# Patient Record
Sex: Female | Born: 1935 | Race: White | Hispanic: No | Marital: Married
Health system: Southern US, Community
[De-identification: ages and names within clinical notes are randomized; demographics above are authoritative.]

---

## 2010-07-05 DEATH — deceased

## 2018-10-03 ENCOUNTER — Inpatient Hospital Stay
Admit: 2018-10-03 | Discharge: 2018-10-18 | Disposition: A | Discharge status: Skilled Nursing Facility | Payer: Medicare Other | Source: Other Acute Inpatient Hospital | Attending: Internal Medicine | Admitting: Internal Medicine

## 2018-10-03 ENCOUNTER — Other Ambulatory Visit (HOSPITAL_COMMUNITY): Payer: Medicare Other

## 2018-10-03 DIAGNOSIS — Z9889 Other specified postprocedural states: Secondary | ICD-10-CM

## 2018-10-03 DIAGNOSIS — J189 Pneumonia, unspecified organism: Secondary | ICD-10-CM

## 2018-10-03 DIAGNOSIS — Z4659 Encounter for fitting and adjustment of other gastrointestinal appliance and device: Secondary | ICD-10-CM

## 2018-10-03 DIAGNOSIS — Z9911 Dependence on respirator [ventilator] status: Secondary | ICD-10-CM

## 2018-10-03 DIAGNOSIS — J90 Pleural effusion, not elsewhere classified: Secondary | ICD-10-CM

## 2018-10-04 ENCOUNTER — Other Ambulatory Visit (HOSPITAL_COMMUNITY): Payer: Medicare Other

## 2018-10-04 LAB — CBC WITH DIFFERENTIAL/PLATELET
Abs Immature Granulocytes: 0.16 10*3/uL — ABNORMAL HIGH (ref 0.00–0.07)
Basophils Absolute: 0 10*3/uL (ref 0.0–0.1)
Basophils Relative: 0 %
Eosinophils Absolute: 0.1 10*3/uL (ref 0.0–0.5)
Eosinophils Relative: 1 %
HCT: 25.2 % — ABNORMAL LOW (ref 36.0–46.0)
Hemoglobin: 7.8 g/dL — ABNORMAL LOW (ref 12.0–15.0)
Immature Granulocytes: 2 %
Lymphocytes Relative: 10 %
Lymphs Abs: 0.9 10*3/uL (ref 0.7–4.0)
MCH: 32.1 pg (ref 26.0–34.0)
MCHC: 31 g/dL (ref 30.0–36.0)
MCV: 103.7 fL — ABNORMAL HIGH (ref 80.0–100.0)
Monocytes Absolute: 0.7 10*3/uL (ref 0.1–1.0)
Monocytes Relative: 7 %
Neutro Abs: 7.2 10*3/uL (ref 1.7–7.7)
Neutrophils Relative %: 80 %
Platelets: 145 10*3/uL — ABNORMAL LOW (ref 150–400)
RBC: 2.43 MIL/uL — ABNORMAL LOW (ref 3.87–5.11)
RDW: 15.5 % (ref 11.5–15.5)
WBC: 9 10*3/uL (ref 4.0–10.5)
nRBC: 1.9 % — ABNORMAL HIGH (ref 0.0–0.2)

## 2018-10-04 LAB — COMPREHENSIVE METABOLIC PANEL
ALT: 19 U/L (ref 0–44)
AST: 21 U/L (ref 15–41)
Albumin: 2.5 g/dL — ABNORMAL LOW (ref 3.5–5.0)
Alkaline Phosphatase: 63 U/L (ref 38–126)
Anion gap: 7 (ref 5–15)
BUN: 34 mg/dL — ABNORMAL HIGH (ref 8–23)
CO2: 28 mmol/L (ref 22–32)
Calcium: 8.1 mg/dL — ABNORMAL LOW (ref 8.9–10.3)
Chloride: 110 mmol/L (ref 98–111)
Creatinine, Ser: 0.76 mg/dL (ref 0.44–1.00)
GFR calc Af Amer: >60 mL/min (ref >60)
GFR calc non Af Amer: >60 mL/min (ref >60)
Glucose, Bld: 195 mg/dL — ABNORMAL HIGH (ref 70–99)
Potassium: 4.2 mmol/L (ref 3.5–5.1)
Sodium: 145 mmol/L (ref 135–145)
Total Bilirubin: 0.7 mg/dL (ref 0.3–1.2)
Total Protein: 5.1 g/dL — ABNORMAL LOW (ref 6.5–8.1)

## 2018-10-04 LAB — HEMOGLOBIN A1C
Hgb A1c MFr Bld: 6.6 % — ABNORMAL HIGH (ref 4.8–5.6)
Mean Plasma Glucose: 142.72 mg/dL

## 2018-10-04 LAB — T4, FREE: Free T4: 1.05 ng/dL (ref 0.61–1.12)

## 2018-10-04 LAB — TSH: TSH: 0.658 u[IU]/mL (ref 0.350–4.500)

## 2018-10-04 LAB — PROTIME-INR
INR: 1.1 (ref 0.8–1.2)
Prothrombin Time: 14.2 seconds (ref 11.4–15.2)

## 2018-10-04 LAB — PHOSPHORUS: Phosphorus: 2.8 mg/dL (ref 2.5–4.6)

## 2018-10-04 LAB — MAGNESIUM: Magnesium: 2.5 mg/dL — ABNORMAL HIGH (ref 1.7–2.4)

## 2018-10-04 MED ORDER — ATORVASTATIN CALCIUM 40 MG PO TABS
40.00 | ORAL_TABLET | ORAL | Status: DC
Start: 2018-10-04 — End: 2018-10-04

## 2018-10-04 MED ORDER — ASPIRIN 81 MG PO CHEW
81.00 | CHEWABLE_TABLET | ORAL | Status: DC
Start: 2018-10-04 — End: 2018-10-04

## 2018-10-04 MED ORDER — GENERIC EXTERNAL MEDICATION
75.00 | Status: DC
Start: 2018-10-04 — End: 2018-10-04

## 2018-10-04 MED ORDER — FUROSEMIDE 40 MG PO TABS
40.00 | ORAL_TABLET | ORAL | Status: DC
Start: 2018-10-04 — End: 2018-10-04

## 2018-10-04 MED ORDER — CLOPIDOGREL BISULFATE 75 MG PO TABS
75.00 | ORAL_TABLET | ORAL | Status: DC
Start: 2018-10-04 — End: 2018-10-04

## 2018-10-04 MED ORDER — HYDRALAZINE HCL 20 MG/ML IJ SOLN
5.00 | INTRAMUSCULAR | Status: DC
Start: ? — End: 2018-10-04

## 2018-10-04 MED ORDER — INSULIN LISPRO 100 UNIT/ML ~~LOC~~ SOLN
1.00 | SUBCUTANEOUS | Status: DC
Start: 2018-10-03 — End: 2018-10-04

## 2018-10-04 MED ORDER — GENERIC EXTERNAL MEDICATION
Status: DC
Start: ? — End: 2018-10-04

## 2018-10-04 MED ORDER — LISINOPRIL 5 MG PO TABS
10.00 | ORAL_TABLET | ORAL | Status: DC
Start: 2018-10-04 — End: 2018-10-04

## 2018-10-04 MED ORDER — ONDANSETRON 4 MG PO TBDP
4.00 | ORAL_TABLET | ORAL | Status: DC
Start: ? — End: 2018-10-04

## 2018-10-04 MED ORDER — TRAMADOL HCL 50 MG PO TABS
50.00 | ORAL_TABLET | ORAL | Status: DC
Start: ? — End: 2018-10-04

## 2018-10-04 MED ORDER — GLUCOSE 40 % PO GEL
15.00 | ORAL | Status: DC
Start: ? — End: 2018-10-04

## 2018-10-04 MED ORDER — METOPROLOL TARTRATE 25 MG PO TABS
25.00 | ORAL_TABLET | ORAL | Status: DC
Start: 2018-10-03 — End: 2018-10-04

## 2018-10-04 MED ORDER — AMLODIPINE BESYLATE 5 MG PO TABS
5.00 | ORAL_TABLET | ORAL | Status: DC
Start: ? — End: 2018-10-04

## 2018-10-04 MED ORDER — ACETAMINOPHEN 500 MG PO TABS
1000.00 | ORAL_TABLET | ORAL | Status: DC
Start: 2018-10-03 — End: 2018-10-04

## 2018-10-04 MED ORDER — FAMOTIDINE 20 MG PO TABS
20.00 | ORAL_TABLET | ORAL | Status: DC
Start: 2018-10-03 — End: 2018-10-04

## 2018-10-04 MED ORDER — HEPARIN SODIUM (PORCINE) 5000 UNIT/ML IJ SOLN
5000.00 | INTRAMUSCULAR | Status: DC
Start: 2018-10-03 — End: 2018-10-04

## 2018-10-04 NOTE — Consult Note (Signed)
Referring Physician: Dr. Ruthann Cancer, MD  Victoria Cantrell is an 85 y.o. female.                       Chief Complaint: Shortness of breath  HPI: 85 years old white female with recent surgery for severe MR and TR with annuloplasties, CAD with stent in 2018, paroxysmal atrial fibrillation, s/p AV nodal ablation, PPM, left atrial exclusion and bovine pericardial patch repair of superior vena cava stenosis has recurrent right pleural effusion. She also has acute on chronic dysphagia and aspiration, h/o vocal cord cyst removal, left renal artery stenosis s/p stent, type 2 DM, hypertension and GERD.   Past medical history: Type 2 DM, chronic dysphagia, paroxysmal atrial fibrillation and as per HPI.  PSH: Cholecystectomy. Inguinal hernia repair, hysterectomy,  Left total hip replacement,  Partial thyroidectomy cyst removal,  MV 26 mm Medtronic Profile 3 D ring,  TV 29 mm Medronic Simulus FLX-C Band annuloplasty ring SVC bovine pericardial patch for stenosis.  The histories are not reviewed yet. Please review them in the "History" navigator section and refresh this [place].  No family history on file. Social History:  No history of tobacco, alcohol, and drug abuse.  Allergies: Not on File  No medications prior to admission.    Results for orders placed or performed during the hospital encounter of 10/03/18 (from the past 48 hour(s))  Comprehensive metabolic panel     Status: Abnormal   Collection Time: 10/04/18  6:55 AM  Result Value Ref Range   Sodium 145 135 - 145 mmol/L   Potassium 4.2 3.5 - 5.1 mmol/L   Chloride 110 98 - 111 mmol/L   CO2 28 22 - 32 mmol/L   Glucose, Bld 195 (H) 70 - 99 mg/dL   BUN 34 (H) 8 - 23 mg/dL   Creatinine, Ser 0.76 0.44 - 1.00 mg/dL   Calcium 8.1 (L) 8.9 - 10.3 mg/dL   Total Protein 5.1 (L) 6.5 - 8.1 g/dL   Albumin 2.5 (L) 3.5 - 5.0 g/dL   AST 21 15 - 41 U/L   ALT 19 0 - 44 U/L   Alkaline Phosphatase 63 38 - 126 U/L   Total Bilirubin 0.7 0.3 -  1.2 mg/dL   GFR calc non Af Amer >60 >60 mL/min   GFR calc Af Amer >60 >60 mL/min   Anion gap 7 5 - 15    Comment: Performed at [facility], [address], [place], [place] [address]  CBC with Differential/Platelet     Status: Abnormal   Collection Time: 10/04/18  6:55 AM  Result Value Ref Range   WBC 9.0 4.0 - 10.5 K/uL   RBC 2.43 (L) 3.87 - 5.11 MIL/uL   Hemoglobin 7.8 (L) 12.0 - 15.0 g/dL   HCT 25.2 (L) 36.0 - 46.0 %   MCV 103.7 (H) 80.0 - 100.0 fL   MCH 32.1 26.0 - 34.0 pg   MCHC 31.0 30.0 - 36.0 g/dL   RDW 15.5 11.5 - 15.5 %   Platelets 145 (L) 150 - 400 K/uL   nRBC 1.9 (H) 0.0 - 0.2 %   Neutrophils Relative % 80 %   Neutro Abs 7.2 1.7 - 7.7 K/uL   Lymphocytes Relative 10 %   Lymphs Abs 0.9 0.7 - 4.0 K/uL   Monocytes Relative 7 %   Monocytes Absolute 0.7 0.1 - 1.0 K/uL   Eosinophils Relative 1 %   Eosinophils Absolute 0.1 0.0 - 0.5  K/uL   Basophils Relative 0 %   Basophils Absolute 0.0 0.0 - 0.1 K/uL   Immature Granulocytes 2 %   Abs Immature Granulocytes 0.16 (H) 0.00 - 0.07 K/uL    Comment: Performed at [facility], [address] [place], [place] [address]  Protime-INR     Status: None   Collection Time: 10/04/18  6:55 AM  Result Value Ref Range   Prothrombin Time 14.2 11.4 - 15.2 seconds   INR 1.1 0.8 - 1.2    Comment: (NOTE) INR goal varies based on device and disease states. Performed at [facility], [address] [place], [place] [address]   Magnesium     Status: Abnormal   Collection Time: 10/04/18  6:55 AM  Result Value Ref Range   Magnesium 2.5 (H) 1.7 - 2.4 mg/dL    Comment: Performed at [facility], [address] [place], [place] [address]  Phosphorus     Status: None   Collection Time: 10/04/18  6:55 AM  Result Value Ref Range   Phosphorus 2.8 2.5 - 4.6 mg/dL    Comment: Performed at [facility], [address] [place], [place] [address]  T4, free     Status: None   Collection Time: 10/04/18  6:55 AM   Result Value Ref Range   Free T4 1.05 0.61 - 1.12 ng/dL    Comment: (NOTE) Biotin ingestion may interfere with free T4 tests. If the results are inconsistent with the TSH level, previous test results, or the clinical presentation, then consider biotin interference. If needed, order repeat testing after stopping biotin. Performed at [facility], [address] [place], [place] [address]   TSH     Status: None   Collection Time: 10/04/18  6:55 AM  Result Value Ref Range   TSH 0.658 0.350 - 4.500 uIU/mL    Comment: Performed by a 3rd Generation assay with a functional sensitivity of <=0.01 uIU/mL. Performed at [facility], [address] [place], [place] [address]   Hemoglobin A1c     Status: Abnormal   Collection Time: 10/04/18  6:55 AM  Result Value Ref Range   Hgb A1c MFr Bld 6.6 (H) 4.8 - 5.6 %    Comment: (NOTE) Pre diabetes:          5.7%-6.4% Diabetes:              >6.4% Glycemic control for   <7.0% adults with diabetes    Mean Plasma Glucose 142.72 mg/dL    Comment: Performed at [facility], [address] [place], [place] [address]   Dg Abd 1 View  Result Date: 10/03/2018 CLINICAL DATA:  NG tube placement. EXAM: ABDOMEN - 1 VIEW COMPARISON:  None. FINDINGS: There is a feeding tube in place. Tube appears to be looped from the distal antrum back into the midbody of the stomach. No dilated bowel. Contrast throughout the nondistended colon. IMPRESSION: Feeding tube appears to be looped from the distal antrum back into the midbody of the stomach. Electronically Signed   By: Francene BoyersJames  Maxwell M.D.   On: 10/03/2018 17:09   Dg Chest Port 1 View  Result Date: 10/04/2018 CLINICAL DATA:  Check pleural effusion EXAM: PORTABLE CHEST 1 VIEW COMPARISON:  None FINDINGS: Cardiac shadow is mildly enlarged. Postsurgical changes are seen. Feeding catheter extends into the stomach. Bilateral pleural effusions are noted large on the left and small on the right. No  pneumothorax is seen. Underlying atelectasis is likely present bilaterally. Pacing device is seen. IMPRESSION: Bilateral pleural effusions left considerably greater than right. Electronically Signed   By: Alcide CleverMark  Lukens M.D.   On: 10/04/2018 08:16    Review Of Systems As per HPI and PMH.  There were no vitals taken for this visit. There is no height or weight on file to calculate BMI. General appearance: alert, cooperative, appears stated age and no distress Head: Normocephalic, atraumatic. Eyes: Blue eyes, pale conjunctiva, corneas clear. PERRL, EOM's intact. Neck: No adenopathy, no carotid bruit, no JVD, supple, symmetrical, trachea midline and thyroid not enlarged. Resp: Basal crackles to auscultation bilaterally. Cardio: Regular rate and rhythm, S1, S2 normal, II/VI systolic murmur, no click, rub or gallop GI: Soft, non-tender; bowel sounds normal; no organomegaly. Extremities: No edema, cyanosis or clubbing. Skin: Warm and dry.  Neurologic: Alert and oriented X 3, normal strength.   Assessment/Plan Right pleural effusion S/P MV annuloplasty ring for severe MR S/P TV annuloplasty ring for severe TR S/P SVC stenosis repair Type 2 DM Hypothyroidism CAD Paroxysmal atrial fibrillation  Awaiting thoracentesis Continue medical treatment 2 D echocardiogram. EKG.  Time spent: Review of old records, Lab, x-rays, EKG, other cardiac tests, examination, discussion with patient over 70 minutes.  Ricki RodriguezAjay S Nathanyal Ashmead, MD  10/04/2018, 11:34 AM

## 2018-10-05 LAB — SARS CORONAVIRUS 2 BY RT PCR (HOSPITAL ORDER, PERFORMED IN ~~LOC~~ HOSPITAL LAB): SARS Coronavirus 2: NEGATIVE

## 2018-10-05 NOTE — Progress Notes (Signed)
Request to IR for thoracentesis - per RN yesterday (7/31) patient has not had COVID test in last 72H. Per radiology protocol all aerosol generating procedures must have COVID test within 72H of procedure. This was explained to patient's RN and order was placed for COVID test to be collected.  Upon chart review today COVID test remains listed as active, but uncollected. I spoke with patient's RN today who states that she was not the RN on duty yesterday and is PRN, as such she was not aware that this test needed to be collected. She states she will work on getting that collected.  IR will continue to follow chart for COVID test results and will perform thoracentesis once results are received.   Candiss Norse, PA-C

## 2018-10-06 ENCOUNTER — Other Ambulatory Visit (HOSPITAL_COMMUNITY): Payer: Medicare Other

## 2018-10-06 ENCOUNTER — Institutional Professional Consult (permissible substitution) (HOSPITAL_COMMUNITY): Payer: Medicare Other

## 2018-10-06 LAB — BASIC METABOLIC PANEL
Anion gap: 11 (ref 5–15)
BUN: 27 mg/dL — ABNORMAL HIGH (ref 8–23)
CO2: 30 mmol/L (ref 22–32)
Calcium: 8.4 mg/dL — ABNORMAL LOW (ref 8.9–10.3)
Chloride: 102 mmol/L (ref 98–111)
Creatinine, Ser: 0.72 mg/dL (ref 0.44–1.00)
GFR calc Af Amer: >60 mL/min (ref >60)
GFR calc non Af Amer: >60 mL/min (ref >60)
Glucose, Bld: 193 mg/dL — ABNORMAL HIGH (ref 70–99)
Potassium: 3.6 mmol/L (ref 3.5–5.1)
Sodium: 143 mmol/L (ref 135–145)

## 2018-10-06 LAB — GLUCOSE, PLEURAL OR PERITONEAL FLUID: Glucose, Fluid: 192 mg/dL

## 2018-10-06 LAB — PROTEIN, PLEURAL OR PERITONEAL FLUID: Total protein, fluid: <3 g/dL

## 2018-10-06 LAB — GRAM STAIN

## 2018-10-06 LAB — LACTATE DEHYDROGENASE, PLEURAL OR PERITONEAL FLUID: LD, Fluid: 133 U/L — ABNORMAL HIGH (ref 3–23)

## 2018-10-06 MED ORDER — LIDOCAINE HCL (PF) 1 % IJ SOLN
INTRAMUSCULAR | Status: AC
  Filled 2018-10-06: qty 30

## 2018-10-06 NOTE — Procedures (Signed)
PROCEDURE SUMMARY:  Successful image-guided left thoracentesis. Yielded 580 milliliters of bloody fluid. Patient tolerated procedure well. EBL: Zero No immediate complications.  Specimen was sent for labs. Post procedure CXR shows no pneumothorax.  Please see imaging section of Epic for full dictation.  Joaquim Nam PA-C 10/06/2018 10:28 AM

## 2018-10-07 LAB — CBC
HCT: 28 % — ABNORMAL LOW (ref 36.0–46.0)
Hemoglobin: 8.8 g/dL — ABNORMAL LOW (ref 12.0–15.0)
MCH: 32.4 pg (ref 26.0–34.0)
MCHC: 31.4 g/dL (ref 30.0–36.0)
MCV: 102.9 fL — ABNORMAL HIGH (ref 80.0–100.0)
Platelets: 217 10*3/uL (ref 150–400)
RBC: 2.72 MIL/uL — ABNORMAL LOW (ref 3.87–5.11)
RDW: 16.8 % — ABNORMAL HIGH (ref 11.5–15.5)
WBC: 13.2 10*3/uL — ABNORMAL HIGH (ref 4.0–10.5)
nRBC: 1.4 % — ABNORMAL HIGH (ref 0.0–0.2)

## 2018-10-07 LAB — PH, BODY FLUID: pH, Body Fluid: 7.6

## 2018-10-07 LAB — BASIC METABOLIC PANEL
Anion gap: 10 (ref 5–15)
BUN: 23 mg/dL (ref 8–23)
CO2: 31 mmol/L (ref 22–32)
Calcium: 8.4 mg/dL — ABNORMAL LOW (ref 8.9–10.3)
Chloride: 102 mmol/L (ref 98–111)
Creatinine, Ser: 0.67 mg/dL (ref 0.44–1.00)
GFR calc Af Amer: >60 mL/min (ref >60)
GFR calc non Af Amer: >60 mL/min (ref >60)
Glucose, Bld: 197 mg/dL — ABNORMAL HIGH (ref 70–99)
Potassium: 3.5 mmol/L (ref 3.5–5.1)
Sodium: 143 mmol/L (ref 135–145)

## 2018-10-07 LAB — MAGNESIUM: Magnesium: 2.4 mg/dL (ref 1.7–2.4)

## 2018-10-08 LAB — MAGNESIUM: Magnesium: 2.5 mg/dL — ABNORMAL HIGH (ref 1.7–2.4)

## 2018-10-08 LAB — CBC
HCT: 26.5 % — ABNORMAL LOW (ref 36.0–46.0)
Hemoglobin: 8.2 g/dL — ABNORMAL LOW (ref 12.0–15.0)
MCH: 31.8 pg (ref 26.0–34.0)
MCHC: 30.9 g/dL (ref 30.0–36.0)
MCV: 102.7 fL — ABNORMAL HIGH (ref 80.0–100.0)
Platelets: 216 10*3/uL (ref 150–400)
RBC: 2.58 MIL/uL — ABNORMAL LOW (ref 3.87–5.11)
RDW: 16.4 % — ABNORMAL HIGH (ref 11.5–15.5)
WBC: 11.1 10*3/uL — ABNORMAL HIGH (ref 4.0–10.5)
nRBC: 1.1 % — ABNORMAL HIGH (ref 0.0–0.2)

## 2018-10-08 LAB — BASIC METABOLIC PANEL
Anion gap: 9 (ref 5–15)
BUN: 24 mg/dL — ABNORMAL HIGH (ref 8–23)
CO2: 33 mmol/L — ABNORMAL HIGH (ref 22–32)
Calcium: 8.1 mg/dL — ABNORMAL LOW (ref 8.9–10.3)
Chloride: 98 mmol/L (ref 98–111)
Creatinine, Ser: 0.58 mg/dL (ref 0.44–1.00)
GFR calc Af Amer: >60 mL/min (ref >60)
GFR calc non Af Amer: >60 mL/min (ref >60)
Glucose, Bld: 120 mg/dL — ABNORMAL HIGH (ref 70–99)
Potassium: 3.3 mmol/L — ABNORMAL LOW (ref 3.5–5.1)
Sodium: 140 mmol/L (ref 135–145)

## 2018-10-08 LAB — HEMOGLOBIN A1C
Hgb A1c MFr Bld: 6.2 % — ABNORMAL HIGH (ref 4.8–5.6)
Mean Plasma Glucose: 131.24 mg/dL

## 2018-10-08 LAB — PHOSPHORUS: Phosphorus: 3.3 mg/dL (ref 2.5–4.6)

## 2018-10-09 LAB — BASIC METABOLIC PANEL
Anion gap: 10 (ref 5–15)
BUN: 23 mg/dL (ref 8–23)
CO2: 31 mmol/L (ref 22–32)
Calcium: 8 mg/dL — ABNORMAL LOW (ref 8.9–10.3)
Chloride: 96 mmol/L — ABNORMAL LOW (ref 98–111)
Creatinine, Ser: 0.51 mg/dL (ref 0.44–1.00)
GFR calc Af Amer: >60 mL/min (ref >60)
GFR calc non Af Amer: >60 mL/min (ref >60)
Glucose, Bld: 95 mg/dL (ref 70–99)
Potassium: 3.4 mmol/L — ABNORMAL LOW (ref 3.5–5.1)
Sodium: 137 mmol/L (ref 135–145)

## 2018-10-10 LAB — POTASSIUM: Potassium: 4.1 mmol/L (ref 3.5–5.1)

## 2018-10-11 ENCOUNTER — Other Ambulatory Visit (HOSPITAL_COMMUNITY): Payer: Medicare Other

## 2018-10-11 LAB — BASIC METABOLIC PANEL
Anion gap: 11 (ref 5–15)
BUN: 24 mg/dL — ABNORMAL HIGH (ref 8–23)
CO2: 31 mmol/L (ref 22–32)
Calcium: 8.4 mg/dL — ABNORMAL LOW (ref 8.9–10.3)
Chloride: 94 mmol/L — ABNORMAL LOW (ref 98–111)
Creatinine, Ser: 0.72 mg/dL (ref 0.44–1.00)
GFR calc Af Amer: >60 mL/min (ref >60)
GFR calc non Af Amer: >60 mL/min (ref >60)
Glucose, Bld: 113 mg/dL — ABNORMAL HIGH (ref 70–99)
Potassium: 3.6 mmol/L (ref 3.5–5.1)
Sodium: 136 mmol/L (ref 135–145)

## 2018-10-11 LAB — CBC
HCT: 27.3 % — ABNORMAL LOW (ref 36.0–46.0)
Hemoglobin: 8.5 g/dL — ABNORMAL LOW (ref 12.0–15.0)
MCH: 31.3 pg (ref 26.0–34.0)
MCHC: 31.1 g/dL (ref 30.0–36.0)
MCV: 100.4 fL — ABNORMAL HIGH (ref 80.0–100.0)
Platelets: 250 10*3/uL (ref 150–400)
RBC: 2.72 MIL/uL — ABNORMAL LOW (ref 3.87–5.11)
RDW: 15.9 % — ABNORMAL HIGH (ref 11.5–15.5)
WBC: 8.2 10*3/uL (ref 4.0–10.5)
nRBC: 0.9 % — ABNORMAL HIGH (ref 0.0–0.2)

## 2018-10-11 LAB — CULTURE, BODY FLUID W GRAM STAIN -BOTTLE: Culture: NO GROWTH

## 2018-10-11 LAB — PHOSPHORUS: Phosphorus: 3.8 mg/dL (ref 2.5–4.6)

## 2018-10-11 LAB — MAGNESIUM: Magnesium: 2.3 mg/dL (ref 1.7–2.4)

## 2018-10-14 ENCOUNTER — Other Ambulatory Visit (HOSPITAL_COMMUNITY): Payer: Medicare Other

## 2018-10-14 LAB — BASIC METABOLIC PANEL
Anion gap: 10 (ref 5–15)
BUN: 20 mg/dL (ref 8–23)
CO2: 26 mmol/L (ref 22–32)
Calcium: 8.3 mg/dL — ABNORMAL LOW (ref 8.9–10.3)
Chloride: 98 mmol/L (ref 98–111)
Creatinine, Ser: 0.69 mg/dL (ref 0.44–1.00)
GFR calc Af Amer: >60 mL/min (ref >60)
GFR calc non Af Amer: >60 mL/min (ref >60)
Glucose, Bld: 128 mg/dL — ABNORMAL HIGH (ref 70–99)
Potassium: 3.8 mmol/L (ref 3.5–5.1)
Sodium: 134 mmol/L — ABNORMAL LOW (ref 135–145)

## 2018-10-14 LAB — CBC
HCT: 28.8 % — ABNORMAL LOW (ref 36.0–46.0)
Hemoglobin: 8.8 g/dL — ABNORMAL LOW (ref 12.0–15.0)
MCH: 30.9 pg (ref 26.0–34.0)
MCHC: 30.6 g/dL (ref 30.0–36.0)
MCV: 101.1 fL — ABNORMAL HIGH (ref 80.0–100.0)
Platelets: 256 10*3/uL (ref 150–400)
RBC: 2.85 MIL/uL — ABNORMAL LOW (ref 3.87–5.11)
RDW: 15.7 % — ABNORMAL HIGH (ref 11.5–15.5)
WBC: 5.9 10*3/uL (ref 4.0–10.5)
nRBC: 0.5 % — ABNORMAL HIGH (ref 0.0–0.2)

## 2018-10-14 LAB — PHOSPHORUS: Phosphorus: 3 mg/dL (ref 2.5–4.6)

## 2018-10-14 LAB — MAGNESIUM: Magnesium: 2.2 mg/dL (ref 1.7–2.4)

## 2018-10-15 LAB — SARS CORONAVIRUS 2 BY RT PCR (HOSPITAL ORDER, PERFORMED IN ~~LOC~~ HOSPITAL LAB): SARS Coronavirus 2: NEGATIVE

## 2018-10-16 ENCOUNTER — Other Ambulatory Visit (HOSPITAL_COMMUNITY): Payer: Medicare Other

## 2018-10-16 ENCOUNTER — Encounter (HOSPITAL_COMMUNITY): Payer: Self-pay | Admitting: Radiology

## 2018-10-16 HISTORY — PX: IR THORACENTESIS ASP PLEURAL SPACE W/IMG GUIDE: IMG5380

## 2018-10-16 LAB — BASIC METABOLIC PANEL
Anion gap: 12 (ref 5–15)
BUN: 19 mg/dL (ref 8–23)
CO2: 24 mmol/L (ref 22–32)
Calcium: 8.4 mg/dL — ABNORMAL LOW (ref 8.9–10.3)
Chloride: 99 mmol/L (ref 98–111)
Creatinine, Ser: 0.84 mg/dL (ref 0.44–1.00)
GFR calc Af Amer: >60 mL/min (ref >60)
GFR calc non Af Amer: >60 mL/min (ref >60)
Glucose, Bld: 115 mg/dL — ABNORMAL HIGH (ref 70–99)
Potassium: 3.9 mmol/L (ref 3.5–5.1)
Sodium: 135 mmol/L (ref 135–145)

## 2018-10-16 MED ORDER — LIDOCAINE HCL (PF) 1 % IJ SOLN
INTRAMUSCULAR | Status: DC | PRN
  Administered 2018-10-16: 5 mL

## 2018-10-16 MED ORDER — LIDOCAINE HCL 1 % IJ SOLN
INTRAMUSCULAR | Status: AC
  Filled 2018-10-16: qty 20

## 2018-10-16 NOTE — Procedures (Signed)
PROCEDURE SUMMARY:  Successful US guided left thoracentesis. Yielded 700 mL of clear amber fluid. Pt tolerated procedure well. No immediate complications.  Specimen was not sent for labs. CXR ordered.  EBL < 5 mL  Ascencion Dike PA-C 10/16/2018 12:38 PM

## 2018-10-17 LAB — SARS CORONAVIRUS 2 BY RT PCR (HOSPITAL ORDER, PERFORMED IN ~~LOC~~ HOSPITAL LAB): SARS Coronavirus 2: NEGATIVE

## 2020-07-03 IMAGING — DX PORTABLE CHEST - 1 VIEW
1 series · 1 of 1 positions shown · non-contrast
Comparison: None

CLINICAL DATA: Check pleural effusion

EXAM:
PORTABLE CHEST 1 VIEW

[chest ap]
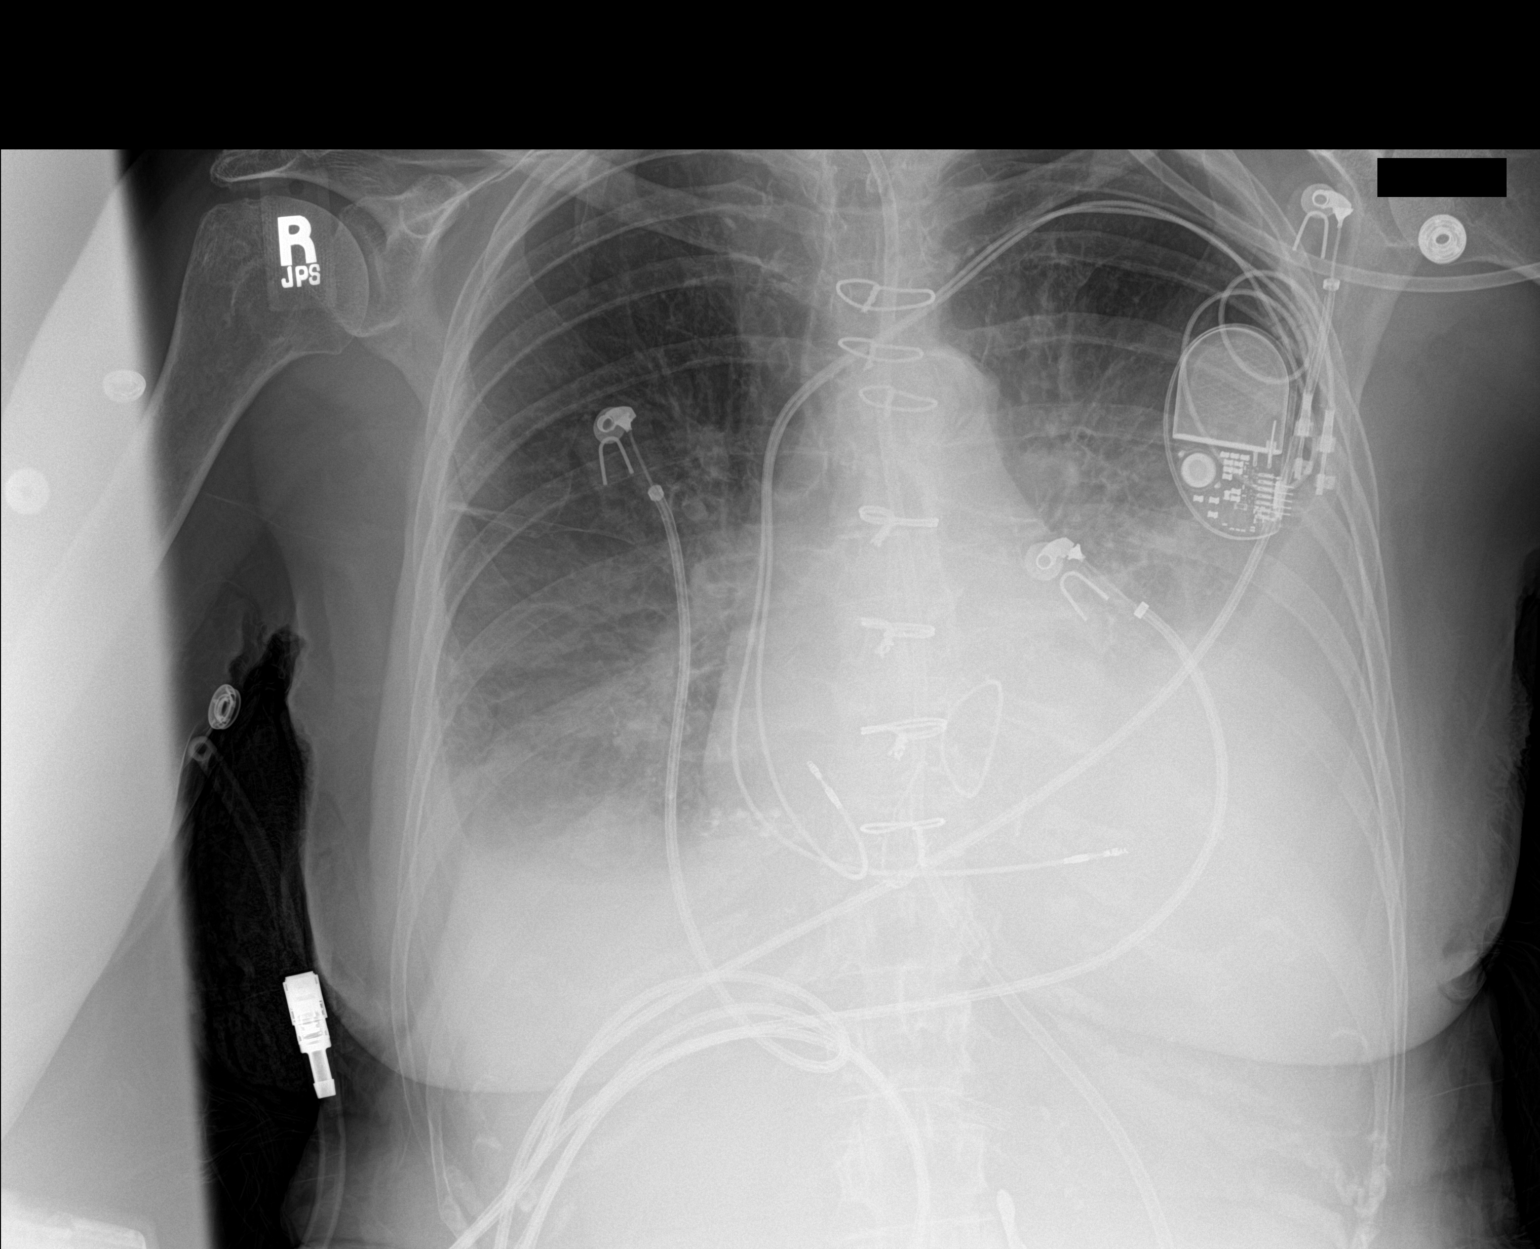

[1 of 1 positions shown; findings below may reference images not displayed]

FINDINGS: Cardiac shadow is mildly enlarged. Postsurgical changes are seen.
Feeding catheter extends into the stomach. Bilateral pleural
effusions are noted large on the left and small on the right. No
pneumothorax is seen. Underlying atelectasis is likely present
bilaterally. Pacing device is seen.
IMPRESSION: Bilateral pleural effusions left considerably greater than right.

## 2020-07-05 IMAGING — DX PORTABLE CHEST - 1 VIEW
1 series · 1 of 1 positions shown · non-contrast
Comparison: Radiograph 10/04/2018

CLINICAL DATA: Thoracentesis.

EXAM:
PORTABLE CHEST 1 VIEW

[chest ap]
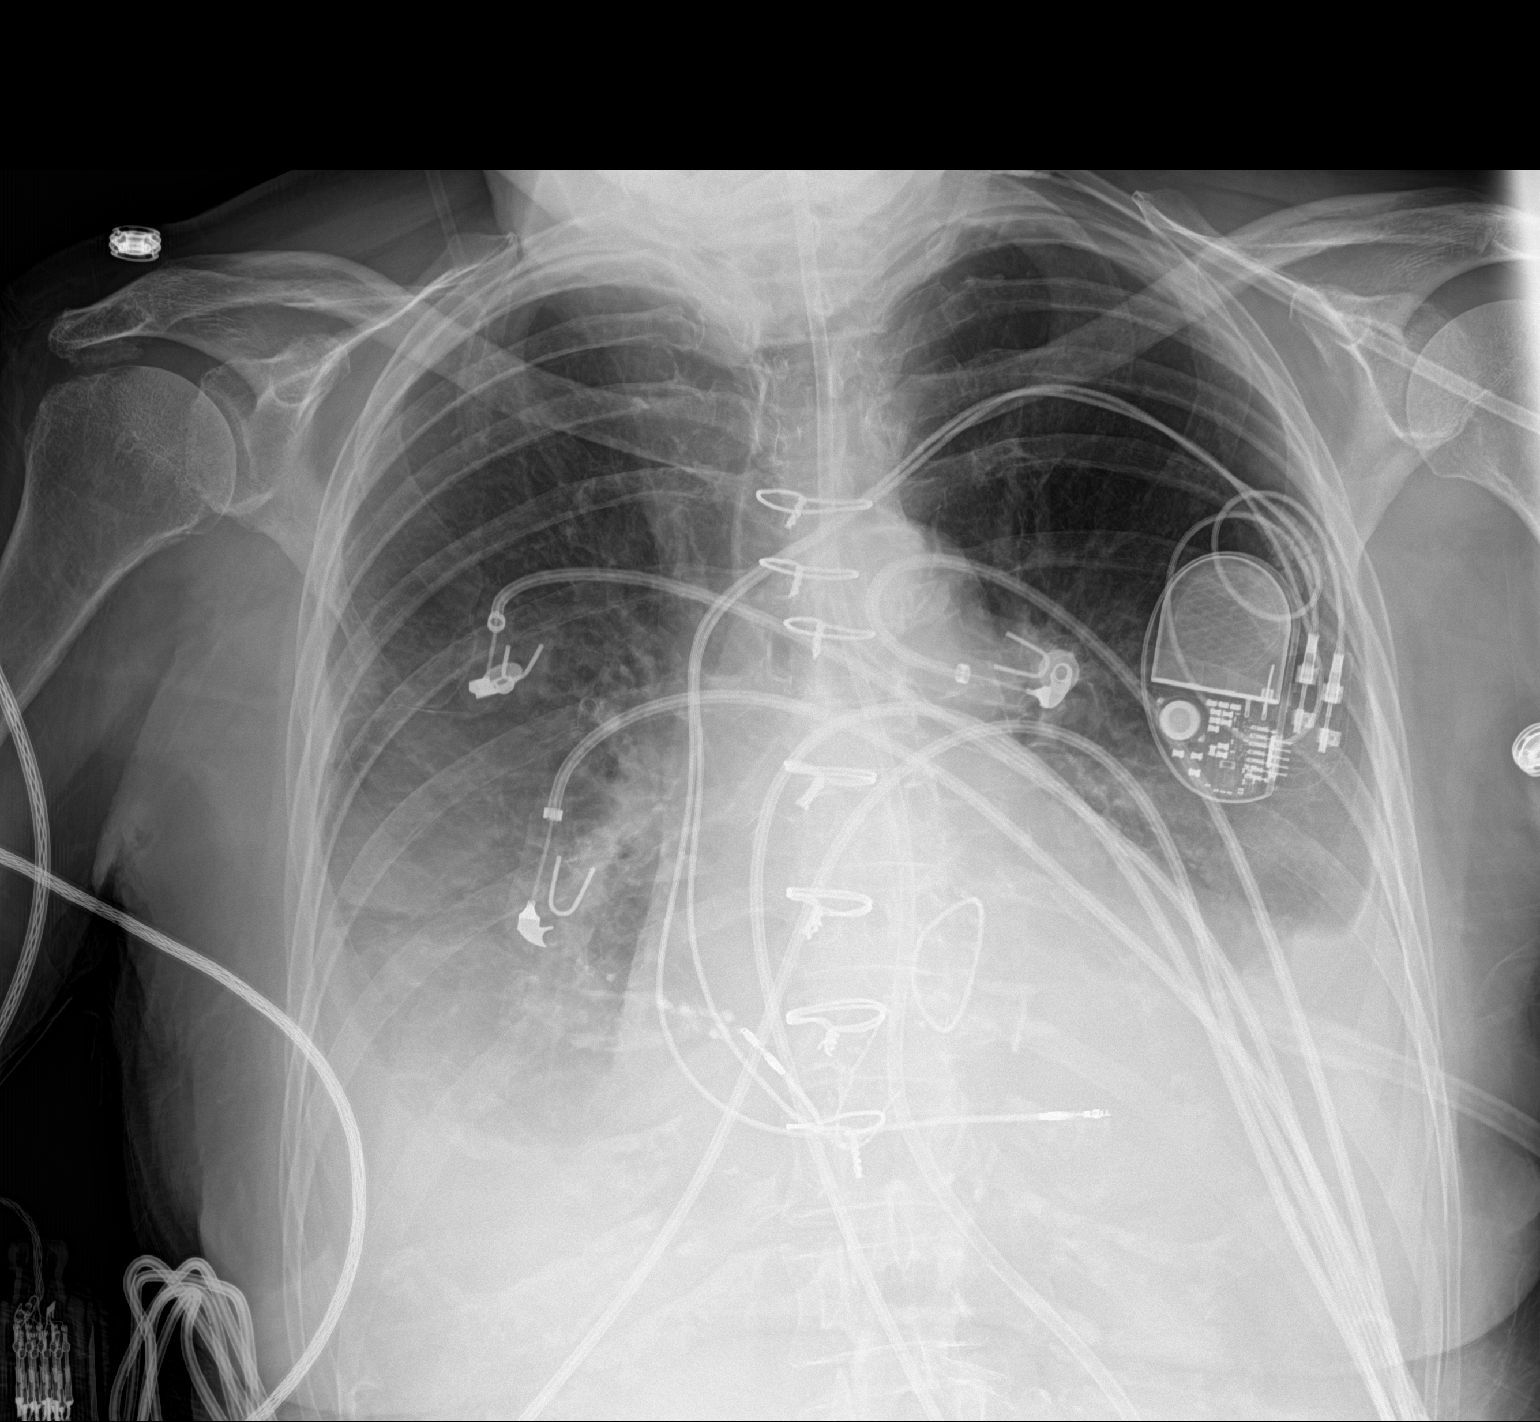

[1 of 1 positions shown; findings below may reference images not displayed]

FINDINGS: Reduction in LEFT pleural fluid.  No pneumothorax.

Bibasilar pleural effusions persist. Feeding tube extends the
stomach.
IMPRESSION: 1. Reduction in LEFT pleural fluid following thoracentesis.
2. No pneumothorax.

## 2020-07-05 IMAGING — US US THORACENTESIS ASP PLEURAL SPACE W/IMG GUIDE
1 series · 6 of 6 positions shown · non-contrast
Comparison: none

INDICATION: Patient with recent surgery for severe MR and TR, coronary artery
disease with stent placement 5724, paroxysmal atrial fibrillation,
recurring pleural effusion. Worsening dyspnea - request IR for
diagnostic and therapeutic thoracentesis at bedside.

[Series 1: us thoracentesis asp pleural space w/img guide · 6 of 6 slices shown]
[im 1/6]
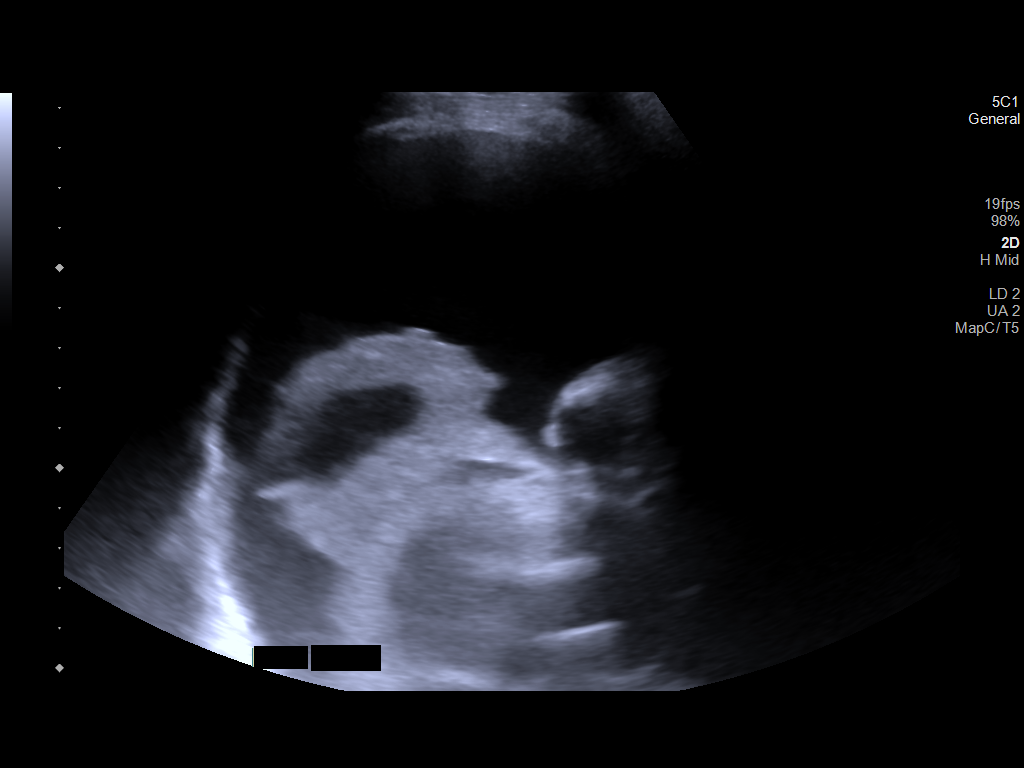
[im 2/6]
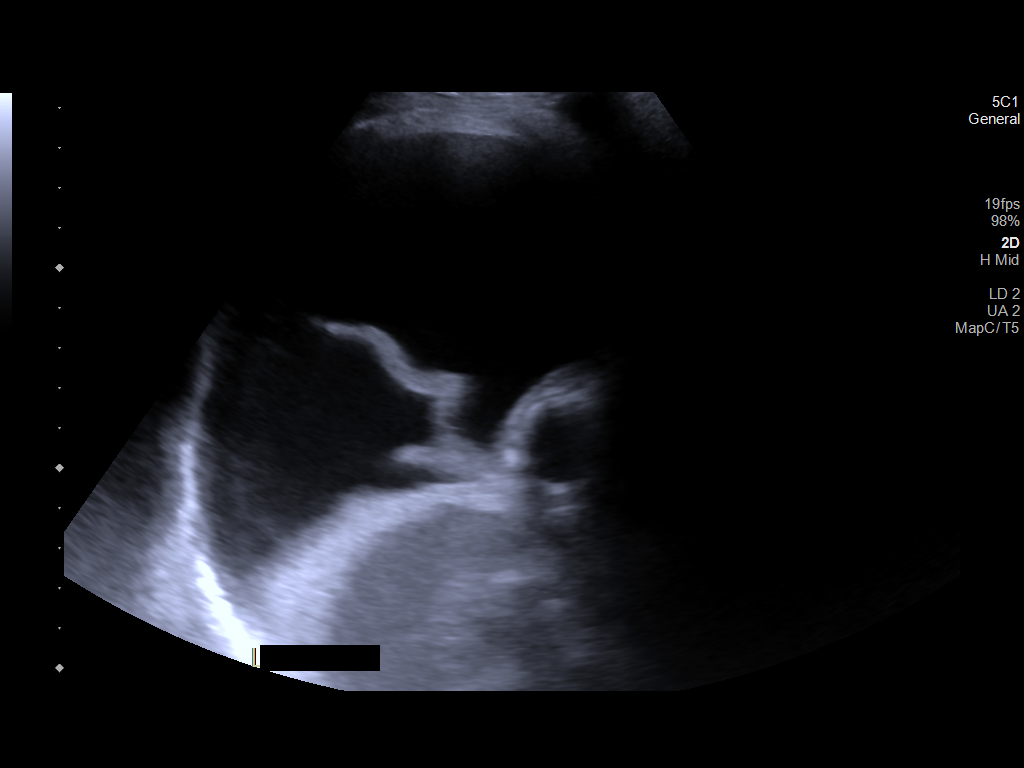
[im 3/6]
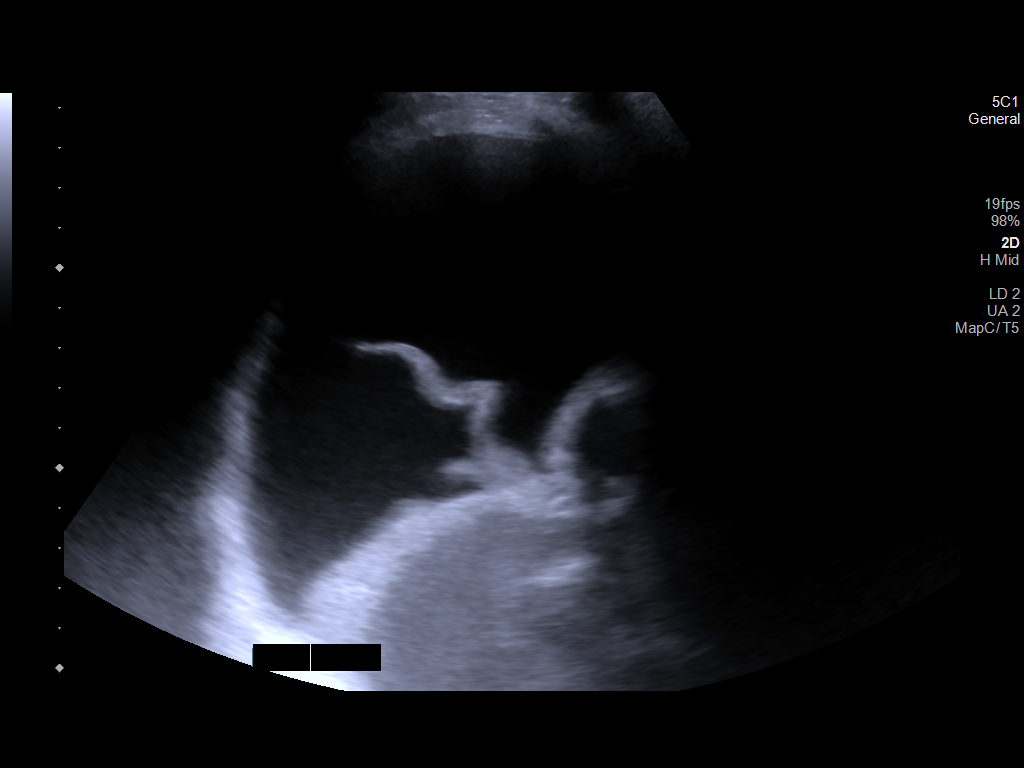
[im 4/6]
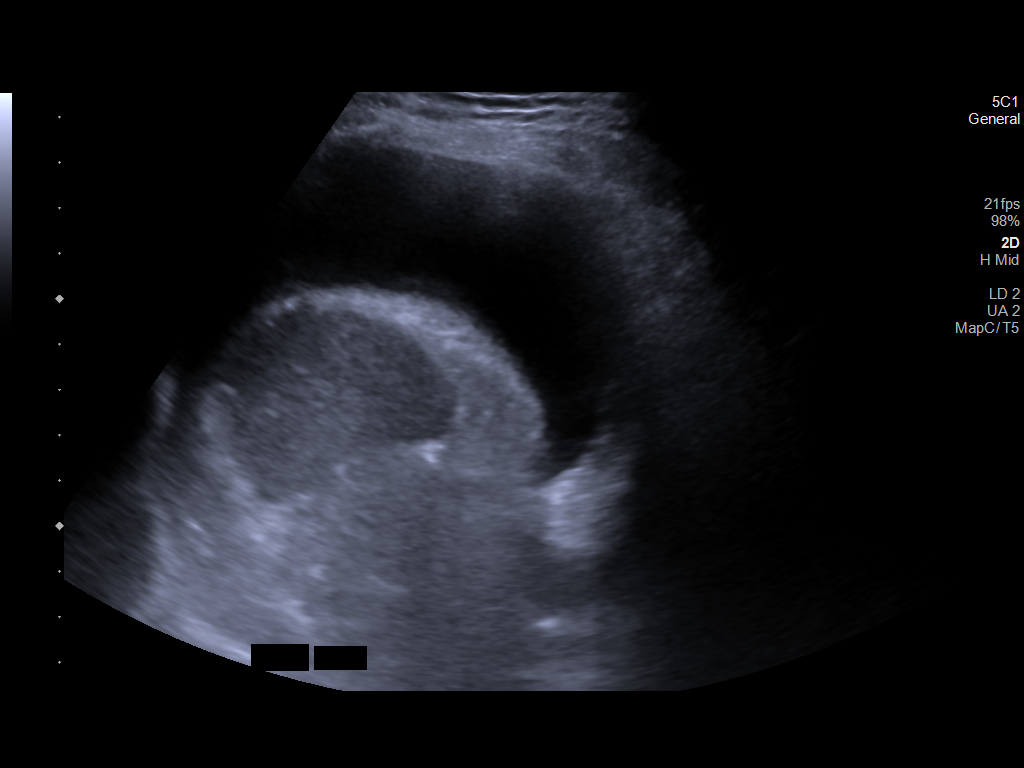
[im 5/6]
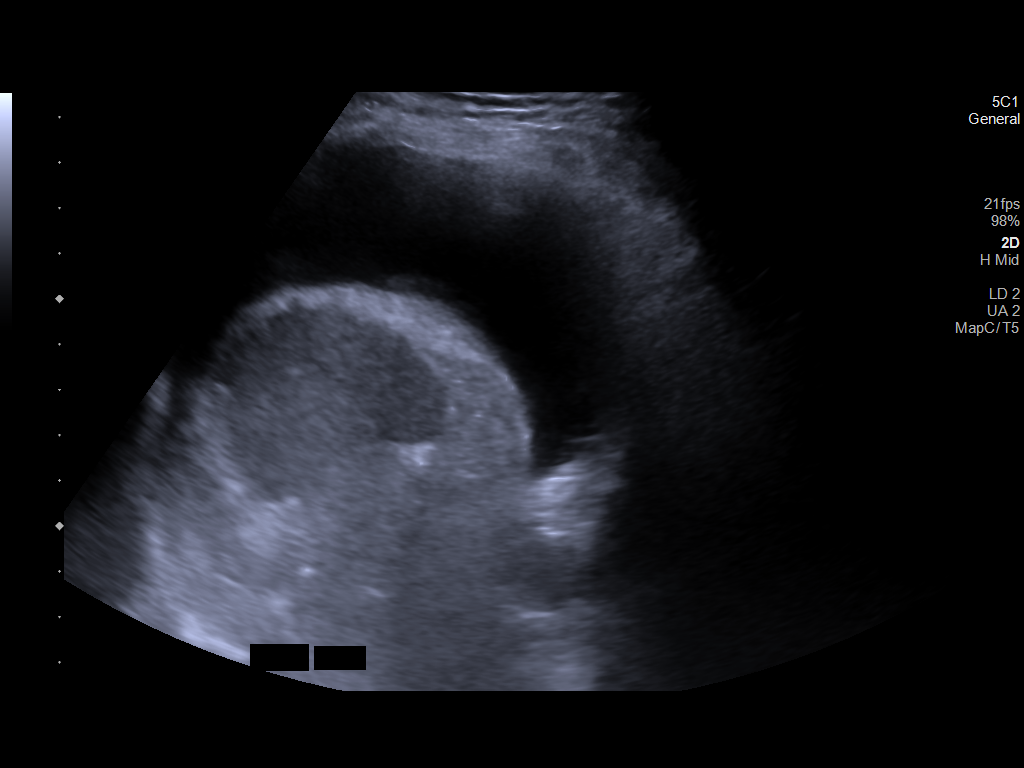
[im 6/6]
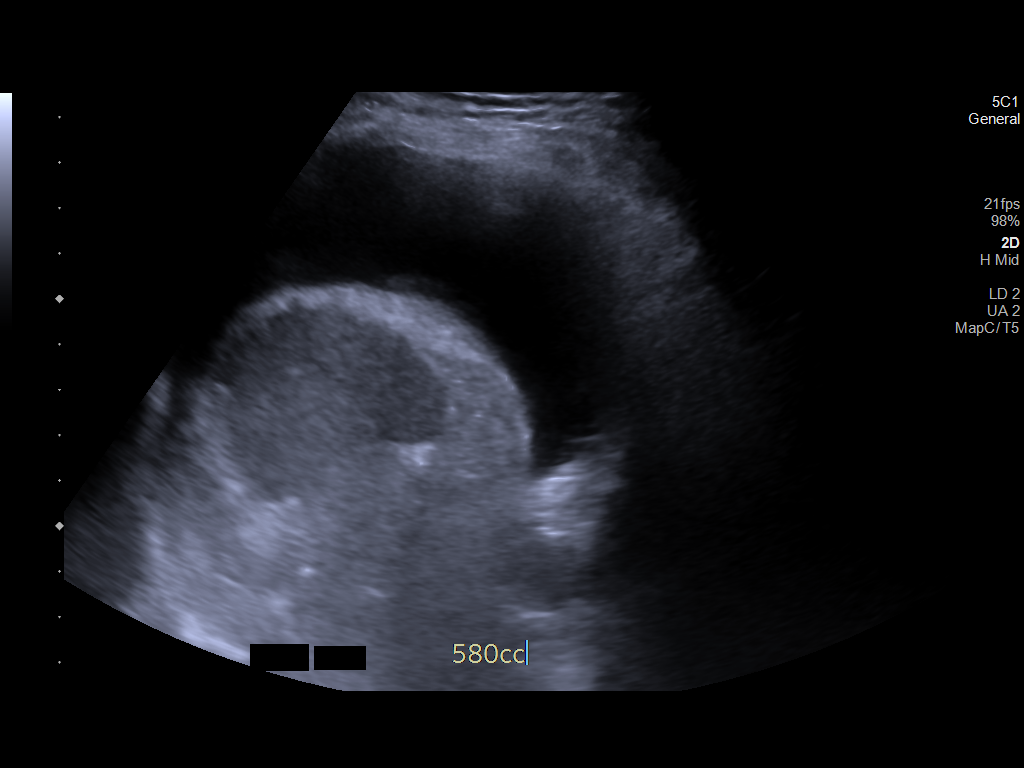

[6 of 6 positions shown; findings below may reference images not displayed]

EXAM:
ULTRASOUND GUIDED LEFT THORACENTESIS

MEDICATIONS:
8 mL 1% lidocaine

COMPLICATIONS:
None immediate.

PROCEDURE:
An ultrasound guided thoracentesis was thoroughly discussed with the
patient and questions answered. The benefits, risks, alternatives
and complications were also discussed. The patient understands and
wishes to proceed with the procedure. Written consent was obtained.

Ultrasound was performed to localize and mark an adequate pocket of
fluid in the left chest. The area was then prepped and draped in the
normal sterile fashion. 1% Lidocaine was used for local anesthesia.
Under ultrasound guidance a 6 French Safe-T-Centesis catheter was
introduced. Thoracentesis was performed. Procedure was aborted due
to patient persistent coughing and complaints of right-sided chest
pain. The catheter was removed and a dressing applied.
FINDINGS: A total of approximately 580 mL of bloody fluid was removed. Samples
were sent to the laboratory as requested by the clinical team.
Procedure was aborted at this amount due to patient with persistent
coughing and complaints right-sided chest pain, residual fluid
remains on post procedure ultrasound.
IMPRESSION: Successful ultrasound guided left thoracentesis yielding 580 mL of
pleural fluid.

Read by Hcini, [place]

Follow-up chest radiograph pending.

## 2020-07-15 IMAGING — DX PORTABLE CHEST - 1 VIEW
1 series · 1 of 1 positions shown · non-contrast
Comparison: 10/14/2018

CLINICAL DATA: Post thoracentesis

EXAM:
PORTABLE CHEST 1 VIEW

[chest]
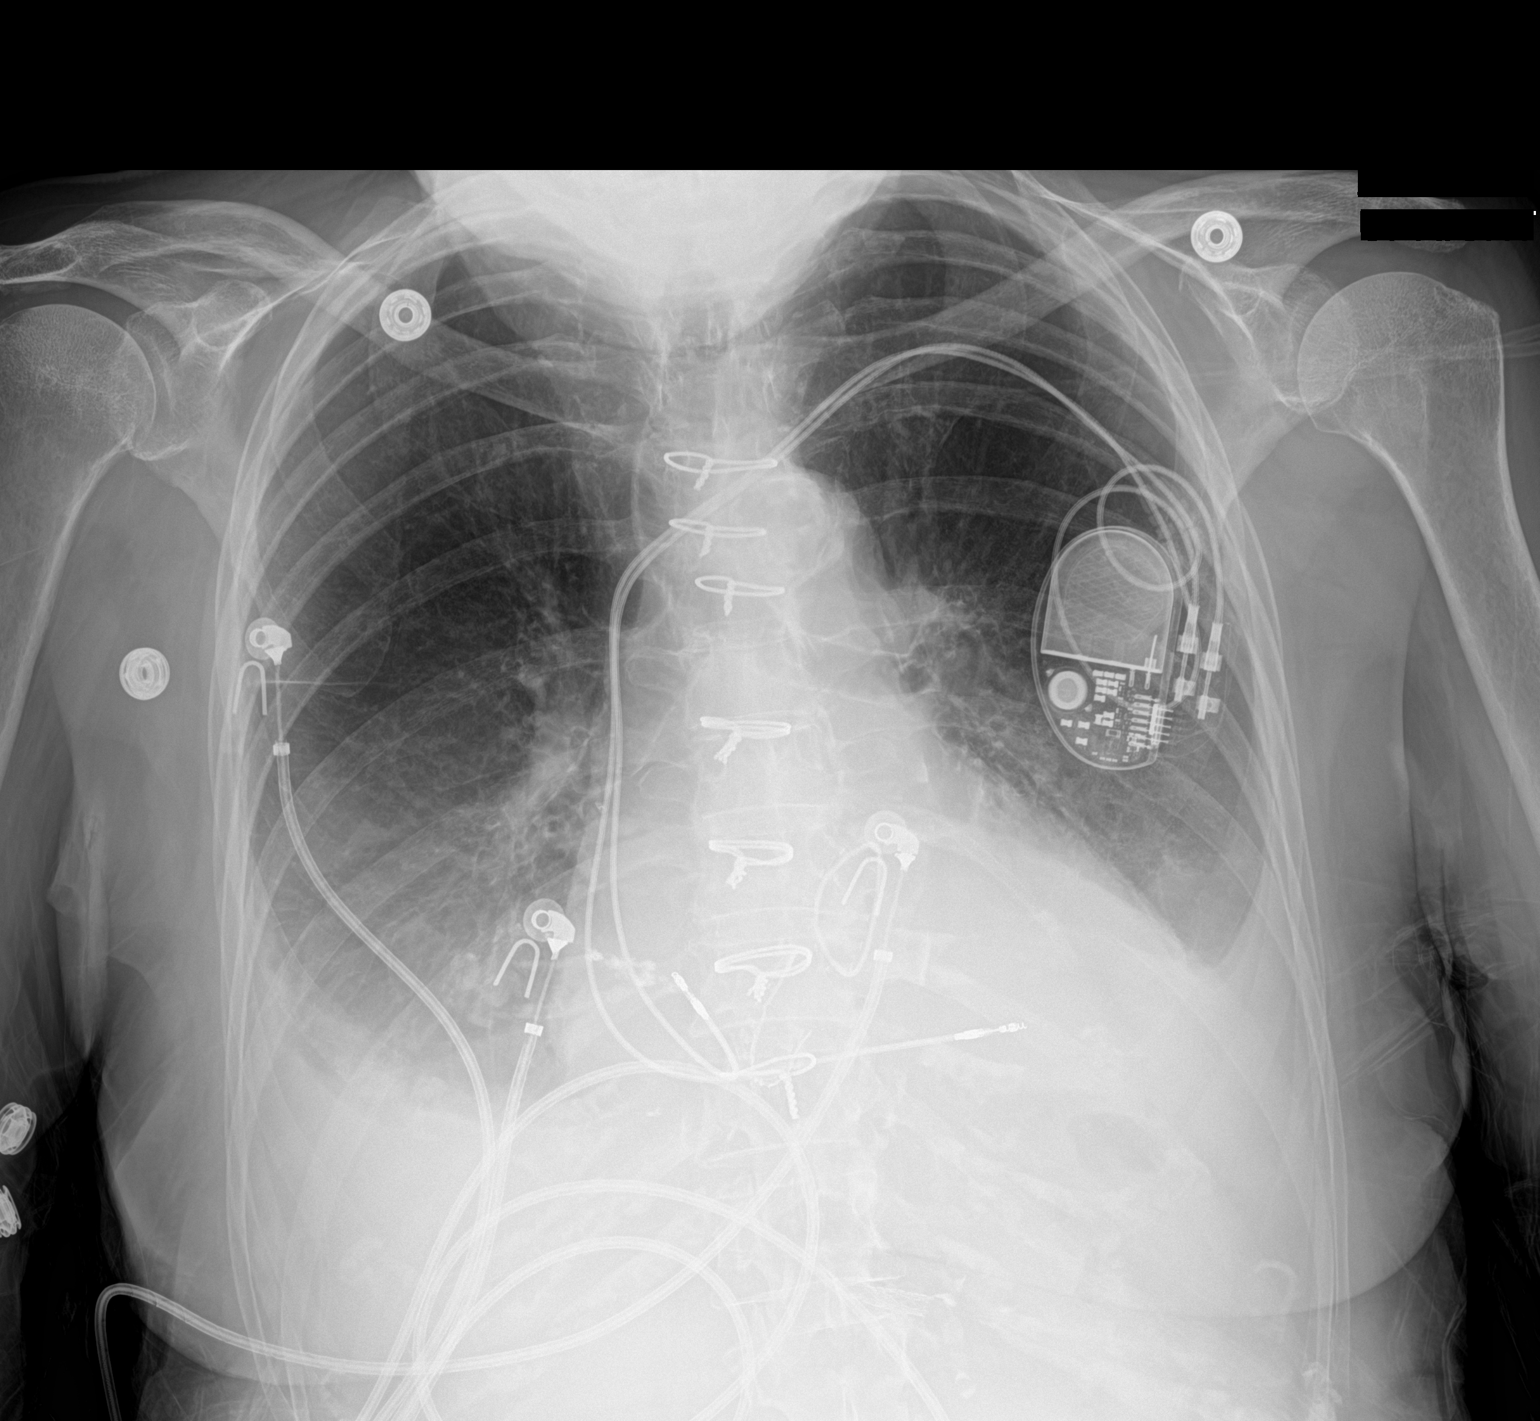

[1 of 1 positions shown; findings below may reference images not displayed]

FINDINGS: Post sternotomy changes. Left-sided pacing device as before. Small
moderate bilateral pleural effusions, no change on the right and
decrease in size on the left. No pneumothorax. Stable enlarged
cardiomediastinal silhouette with bibasilar airspace disease.
IMPRESSION: 1. Small moderate bilateral pleural effusions, decreased on the left
and no change on the right. Negative for pneumothorax
2. Stable cardiomegaly and basilar airspace disease

## 2020-07-15 IMAGING — US IR THORACENTESIS ASP PLEURAL SPACE W/IMG GUIDE
1 series · 2 of 2 positions shown · non-contrast
Comparison: none

INDICATION: Shortness of breath. Recent open heart surgery. Recurrent left-sided
pleural effusion. Request for therapeutic thoracentesis.

[Series 1: ir (id) (id)/(id)/(id) ir · 2 of 2 slices shown]
[im 1/2]
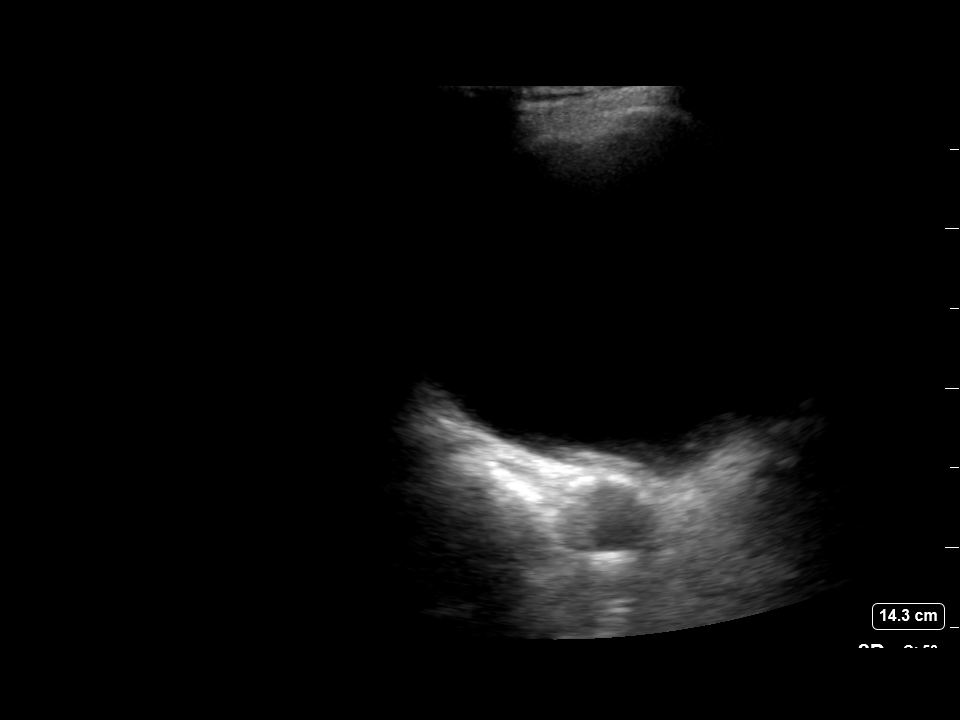
[im 2/2]
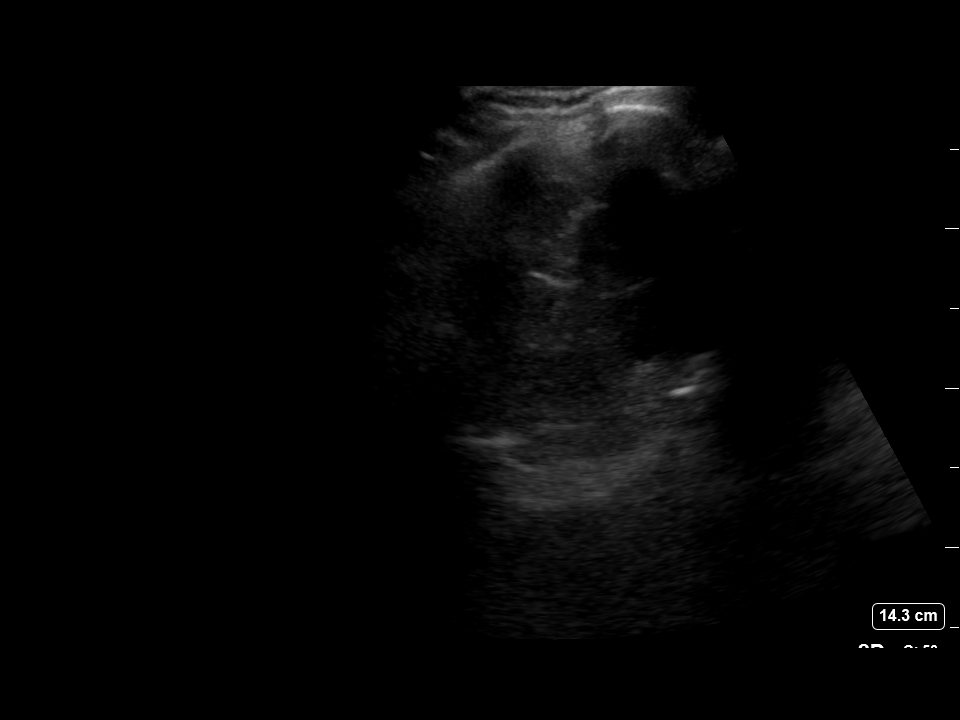

[2 of 2 positions shown; findings below may reference images not displayed]

EXAM:
ULTRASOUND GUIDED LEFT THORACENTESIS

MEDICATIONS:
None.

COMPLICATIONS:
None immediate.

PROCEDURE:
An ultrasound guided thoracentesis was thoroughly discussed with the
patient and questions answered. The benefits, risks, alternatives
and complications were also discussed. The patient understands and
wishes to proceed with the procedure. Written consent was obtained.

Ultrasound was performed to localize and mark an adequate pocket of
fluid in the left chest. The area was then prepped and draped in the
normal sterile fashion. 1% Lidocaine was used for local anesthesia.
Under ultrasound guidance a 6 Fr Safe-T-Centesis catheter was
introduced. Thoracentesis was performed. The catheter was removed
and a dressing applied.
FINDINGS: A total of approximately 700 mL of clear, amber colored fluid was
removed.
IMPRESSION: Successful ultrasound guided left thoracentesis yielding 700 mL of
pleural fluid.
